# Patient Record
Sex: Male | Born: 1995 | Race: Black or African American | Hispanic: No | Marital: Single | Smoking: Current every day smoker
Health system: Southern US, Community
[De-identification: ages and names within clinical notes are randomized; demographics above are authoritative.]

---
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
## 2017-06-26 ENCOUNTER — Encounter (HOSPITAL_COMMUNITY): Payer: Self-pay | Admitting: Emergency Medicine

## 2017-06-26 ENCOUNTER — Ambulatory Visit (HOSPITAL_COMMUNITY)
Admission: EM | Admit: 2017-06-26 | Discharge: 2017-06-26 | Disposition: A | Discharge status: Home / Self Care | Payer: 59 | Attending: Internal Medicine | Admitting: Internal Medicine

## 2017-06-26 ENCOUNTER — Ambulatory Visit (INDEPENDENT_AMBULATORY_CARE_PROVIDER_SITE_OTHER): Payer: 59

## 2017-06-26 DIAGNOSIS — B9789 Other viral agents as the cause of diseases classified elsewhere: Secondary | ICD-10-CM

## 2017-06-26 DIAGNOSIS — J069 Acute upper respiratory infection, unspecified: Secondary | ICD-10-CM

## 2017-06-26 MED ORDER — FLUTICASONE PROPIONATE 50 MCG/ACT NA SUSP: 2.0000 | Freq: Every day | NASAL | 0 refills | Status: AC

## 2017-06-26 MED ORDER — BENZONATATE 100 MG PO CAPS: 100.0000 mg | ORAL_CAPSULE | Freq: Three times a day (TID) | ORAL | 0 refills | Status: AC | PRN

## 2017-06-26 MED ORDER — CETIRIZINE-PSEUDOEPHEDRINE ER 5-120 MG PO TB12: 1.0000 | ORAL_TABLET | Freq: Every day | ORAL | 0 refills | Status: AC

## 2017-06-26 NOTE — ED Provider Notes (Addendum)
[facility]   [id number] 06/26/17 Arrival Time: 1634  SUBJECTIVE:  Mark Burke is a 22 y.o. male who presents with improving cough for the past week.  Denies positive sick exposure or precipitating event.  Describes cough as intermittent and productive with yellow sputum.  Has tried theraflu without relief. Denies aggravating factors.  Reports previous symptoms in the past that improved with cough syrup.   Complains of nausea, SOB, sore throat, congestion, and rhinorrhea.  Denies fever, chills, fatigue, wheezing, chest pain, changes in bowel or bladder habits.    ROS: As per HPI.  History reviewed. No pertinent past medical history. History reviewed. No pertinent surgical history. No Known Allergies No current facility-administered medications on file prior to encounter.    No current outpatient medications on file prior to encounter.    Social History   Socioeconomic History  . Marital status: Single    Spouse name: Not on file  . Number of children: Not on file  . Years of education: Not on file  . Highest education level: Not on file  Occupational History  . Not on file  Social Needs  . Financial resource strain: Not on file  . Food insecurity:    Worry: Not on file    Inability: Not on file  . Transportation needs:    Medical: Not on file    Non-medical: Not on file  Tobacco Use  . Smoking status: Current Every Day Smoker  . Smokeless tobacco: Never Used  Substance and Sexual Activity  . Alcohol use: Never    Frequency: Never  . Drug use: Not on file  . Sexual activity: Not on file  Lifestyle  . Physical activity:    Days per week: Not on file    Minutes per session: Not on file  . Stress: Not on file  Relationships  . Social connections:    Talks on phone: Not on file    Gets together: Not on file    Attends religious service: Not on file    Active member of club or organization: Not on file    Attends meetings of clubs or  organizations: Not on file    Relationship status: Not on file  . Intimate partner violence:    Fear of current or ex partner: Not on file    Emotionally abused: Not on file    Physically abused: Not on file    Forced sexual activity: Not on file  Other Topics Concern  . Not on file  Social History Narrative  . Not on file   History reviewed. No pertinent family history.   OBJECTIVE:  Vitals:   06/26/17 1658  BP: 136/71  Pulse: 68  Resp: 18  Temp: 98.8 F (37.1 C)  TempSrc: Oral  SpO2: 98%     General appearance: AOx3; appears fatigued HEENT: Ears: EACs clear, TMs pearly gray with visible cone of light; Eye: PERRL.  EOM grossly intact.  Sinuses nontender; mild clear rhinorrhea; tonsils nonerythematous, uvula midline Neck: supple without LAD Lungs: CTA bilaterally Heart: regular rate and rhythm.  Radial pulses 2+ symmetrical bilaterally Skin: warm and dry Psychological: alert and cooperative; normal mood and affect  DIAGNOSTIC STUDIES:   CLINICAL DATA: 22 year old male with illness for 2 weeks. Productive cough. Smoker.  EXAM: CHEST - 2 VIEW  COMPARISON: None.  FINDINGS: Lung volumes are at the upper limits of normal. Cardiac size is at the upper limits of normal. Other mediastinal contours are within normal limits. Visualized  tracheal air column is within normal limits. No pneumothorax, pulmonary edema, pleural effusion or confluent pulmonary opacity.  Mild diffuse increased pulmonary interstitial markings may be related to smoking. Mild levoconvex thoracolumbar junction level scoliosis. No other osseous abnormality. Negative visible bowel gas pattern.  IMPRESSION: 1. Bilateral increased pulmonary interstitial markings could be related to smoking, but consider viral/atypical respiratory infection. 2. No other acute cardiopulmonary abnormality. Lung volumes and cardiac size are at the upper limits of normal.   Electronically Signed By: Odessa Fleming  M.D. On: 06/26/2017 17:45  I have reviewed the x-rays myself and the radiologist interpretation. I am in agreement with the radiologist interpretation.     ASSESSMENT & PLAN:  1. Viral URI with cough     Meds ordered this encounter  Medications  . cetirizine-pseudoephedrine (ZYRTEC-D) 5-120 MG tablet    Sig: Take 1 tablet by mouth daily.    Dispense:  30 tablet    Refill:  0    Order Specific Question:   Supervising Provider    Answer:   Isa Rankin [phone]  . fluticasone (FLONASE) 50 MCG/ACT nasal spray    Sig: Place 2 sprays into both nostrils daily.    Dispense:  16 g    Refill:  0    Order Specific Question:   Supervising Provider    Answer:   Isa Rankin [phone]  . benzonatate (TESSALON) 100 MG capsule    Sig: Take 1 capsule (100 mg total) by mouth 3 (three) times daily as needed for cough.    Dispense:  21 capsule    Refill:  0    Order Specific Question:   Supervising Provider    Answer:   Isa Rankin [[id number]]    Get plenty of rest and push fluids Use OTC medication as needed for symptomatic relief Prescribed tessolone perles as needed for cough Prescribed zyrtec D for nasal symptoms Flonase for nasal symptoms Follow up with PCP if symptoms persist Return or go to ER if you have any new or worsening symptoms   Reviewed expectations re: course of current medical issues. Questions answered. Outlined signs and symptoms indicating need for more acute intervention. Patient verbalized understanding. After Visit Summary given.          Rennis Harding, PA-C 06/26/17 1811    Rennis Harding, PA-C 06/26/17 1812

## 2017-06-26 NOTE — ED Triage Notes (Signed)
Pt sts URI sx  

## 2017-06-26 NOTE — Discharge Instructions (Addendum)
Get plenty of rest and push fluids Use OTC medication as needed for symptomatic relief Prescribed tessolone perles as needed for cough Prescribed zyrtec D for nasal symptoms Flonase for nasal symptoms Follow up with PCP if symptoms persist Return or go to ER if you have any new or worsening symptoms

## 2017-07-17 ENCOUNTER — Ambulatory Visit (HOSPITAL_COMMUNITY)
Admission: EM | Admit: 2017-07-17 | Discharge: 2017-07-17 | Disposition: A | Discharge status: Home / Self Care | Payer: 59 | Attending: Family Medicine | Admitting: Family Medicine

## 2017-07-17 ENCOUNTER — Encounter (HOSPITAL_COMMUNITY): Payer: Self-pay | Admitting: *Deleted

## 2017-07-17 DIAGNOSIS — H169 Unspecified keratitis: Secondary | ICD-10-CM | POA: Diagnosis not present

## 2017-07-17 MED ORDER — TETRACAINE HCL 0.5 % OP SOLN
OPHTHALMIC | Status: AC
  Filled 2017-07-17: qty 4

## 2017-07-17 NOTE — Discharge Instructions (Addendum)
Further evaluation and management recommend with ophthalmology  Appointment made at 11:00 o'clock today Patient aware and in agreement with this plan

## 2017-07-17 NOTE — ED Triage Notes (Addendum)
Patient reports left ear pain-no drainage or itching. Denies injury. Does not war contacts. Reports 3 day history of the pain.   Patients eye watering during triage. Patient states it feels like something is in my eye, patient points to white dot on iris.

## 2017-07-17 NOTE — ED Provider Notes (Signed)
[facility]   [id number] 07/17/17 Arrival Time: 0916   SUBJECTIVE:  Mark Burke is a 22 y.o. male who presents with complaint of FB sensation in left eye that began abruptly 1 week ago.  Denies a precipitating event, trauma, or personal contact with bacterial conjunctivitis, but was playing football outside around the time of symtpoms.  Has tried OTC eye drops with temporary relief.  Symptoms are made worse with rubbing eye.  Reports photophobia, excessive tearing, and eye irritation.  Denies eye pain, discharge, itching, vision changes, or double vision.     Denies contact lens use.    ROS: As per HPI.  History reviewed. No pertinent past medical history. History reviewed. No pertinent surgical history. No Known Allergies No current facility-administered medications on file prior to encounter.    Current Outpatient Medications on File Prior to Encounter  Medication Sig Dispense Refill  . benzonatate (TESSALON) 100 MG capsule Take 1 capsule (100 mg total) by mouth 3 (three) times daily as needed for cough. 21 capsule 0  . cetirizine-pseudoephedrine (ZYRTEC-D) 5-120 MG tablet Take 1 tablet by mouth daily. 30 tablet 0  . fluticasone (FLONASE) 50 MCG/ACT nasal spray Place 2 sprays into both nostrils daily. 16 g 0   Social History   Socioeconomic History  . Marital status: Single    Spouse name: Not on file  . Number of children: Not on file  . Years of education: Not on file  . Highest education level: Not on file  Occupational History  . Not on file  Social Needs  . Financial resource strain: Not on file  . Food insecurity:    Worry: Not on file    Inability: Not on file  . Transportation needs:    Medical: Not on file    Non-medical: Not on file  Tobacco Use  . Smoking status: Current Every Day Smoker  . Smokeless tobacco: Never Used  Substance and Sexual Activity  . Alcohol use: Never    Frequency: Never  . Drug use: Not on file  . Sexual  activity: Not on file  Lifestyle  . Physical activity:    Days per week: Not on file    Minutes per session: Not on file  . Stress: Not on file  Relationships  . Social connections:    Talks on phone: Not on file    Gets together: Not on file    Attends religious service: Not on file    Active member of club or organization: Not on file    Attends meetings of clubs or organizations: Not on file    Relationship status: Not on file  . Intimate partner violence:    Fear of current or ex partner: Not on file    Emotionally abused: Not on file    Physically abused: Not on file    Forced sexual activity: Not on file  Other Topics Concern  . Not on file  Social History Narrative  . Not on file   Family History  Problem Relation Age of Onset  . Healthy Mother   . Healthy Father     OBJECTIVE:    Visual Acuity  Right Eye Distance:   Left Eye Distance:   Bilateral Distance:    Right Eye Near: R Near: 20/25 Left Eye Near:  L Near: 20/25 Bilateral Near:      Vitals:   07/17/17 1001  BP: (!) 108/53  Pulse: 62  Resp: 16  Temp: 98.5 F (36.9 C)  TempSrc: Oral  SpO2: 100%    General appearance: alert; appears uncomfortable; sitting in dark room Eyes: LEFT EYE:  conjunctiva: 1+ injection with white patch appreciated in the 6 o' clock position of conjunctiva overlying the iris; no florescence uptake appreciated Neck: supple Lungs: clear to auscultation bilaterally Heart: regular rate and rhythm Skin: warm and dry Psychological: alert and cooperative; normal mood and affect   ASSESSMENT & PLAN:  1. Keratitis     No orders of the defined types were placed in this encounter.  Further evaluation and management recommend with ophthalmology  Appointment made at 11:00 o'clock today Patient aware and in agreement with this plan  Reviewed expectations re: course of current medical issues. Questions answered. Outlined signs and symptoms indicating need for more acute  intervention. Patient verbalized understanding. After Visit Summary given.   Rennis Harding, PA-C 07/17/17 1050

## 2019-02-07 ENCOUNTER — Encounter (HOSPITAL_COMMUNITY): Payer: Self-pay | Admitting: *Deleted

## 2019-02-07 ENCOUNTER — Other Ambulatory Visit: Payer: Self-pay

## 2019-02-07 ENCOUNTER — Emergency Department (HOSPITAL_COMMUNITY)
Admission: EM | Admit: 2019-02-07 | Discharge: 2019-02-07 | Disposition: A | Discharge status: Home / Self Care | Payer: 59 | Attending: Emergency Medicine | Admitting: Emergency Medicine

## 2019-02-07 DIAGNOSIS — H1032 Unspecified acute conjunctivitis, left eye: Secondary | ICD-10-CM | POA: Diagnosis not present

## 2019-02-07 DIAGNOSIS — H5712 Ocular pain, left eye: Secondary | ICD-10-CM | POA: Diagnosis present

## 2019-02-07 MED ORDER — IBUPROFEN 400 MG PO TABS
600.0000 mg | ORAL_TABLET | Freq: Once | ORAL | Status: AC
  Administered 2019-02-07: 600 mg via ORAL
  Filled 2019-02-07: qty 1

## 2019-02-07 MED ORDER — POLYMYXIN B-TRIMETHOPRIM 10000-0.1 UNIT/ML-% OP SOLN
1.0000 [drp] | OPHTHALMIC | Status: DC
  Administered 2019-02-07: 1 [drp] via OPHTHALMIC
  Filled 2019-02-07: qty 10

## 2019-02-07 MED ORDER — FLUORESCEIN SODIUM 1 MG OP STRP
1.0000 | ORAL_STRIP | Freq: Once | OPHTHALMIC | Status: AC
  Administered 2019-02-07: 1 via OPHTHALMIC
  Filled 2019-02-07: qty 1

## 2019-02-07 NOTE — ED Provider Notes (Signed)
[facility] EMERGENCY DEPARTMENT Provider Note   CSN: [id number] Arrival date & time: 02/07/19  2056     History Chief Complaint  Patient presents with  . Eye Problem    Mark Burke is a 24 y.o. male with no pertinent past medical history who presents today for evaluation of left eye pain.  He reports that for about 1 week he has had left eye itching, pain, and watering.  He states that when he wakes up in the morning it is crusted shut.  He states that about 2 or 3 weeks ago he saw a friend's child who had an eye infection however there was over a week between that and him developing his symptoms.  He denies any vision changes.  No fevers or headache.  No significant photophobia.  He denies any trauma.  He denies any concern for foreign body.  He does not wear contacts.   HPI     History reviewed. No pertinent past medical history.  There are no problems to display for this patient.   History reviewed. No pertinent surgical history.     Family History  Problem Relation Age of Onset  . Healthy Mother   . Healthy Father     Social History   Tobacco Use  . Smoking status: Current Every Day Smoker  . Smokeless tobacco: Never Used  Substance Use Topics  . Alcohol use: Never  . Drug use: Not on file    Home Medications Prior to Admission medications   Medication Sig Start Date End Date Taking? Authorizing Provider  benzonatate (TESSALON) 100 MG capsule Take 1 capsule (100 mg total) by mouth 3 (three) times daily as needed for cough. 06/26/17   Wurst, [place], PA-C  cetirizine-pseudoephedrine (ZYRTEC-D) 5-120 MG tablet Take 1 tablet by mouth daily. 06/26/17   Wurst, [place], PA-C  fluticasone (FLONASE) 50 MCG/ACT nasal spray Place 2 sprays into both nostrils daily. 06/26/17   Wurst, [place], PA-C    Allergies    Patient has no known allergies.  Review of Systems   Review of Systems  Constitutional: Negative for chills and fever.  Eyes:  Positive for pain, discharge, redness and itching. Negative for photophobia and visual disturbance.  All other systems reviewed and are negative.   Physical Exam Updated Vital Signs BP 116/68 (BP Location: Right Arm)   Pulse 68   Temp 98.5 F (36.9 C) (Oral)   Resp 16   Ht 6\' 3"  (1.905 m)   Wt 86.6 kg   SpO2 100%   BMI 23.87 kg/m   Physical Exam Vitals and nursing note reviewed.  Constitutional:      General: He is not in acute distress. HENT:     Head: Normocephalic and atraumatic.     Nose: Nose normal.     Mouth/Throat:     Mouth: Mucous membranes are moist.  Eyes:     General: Lids are normal. Vision grossly intact. Gaze aligned appropriately.        Left eye: Discharge present.    Conjunctiva/sclera:     Right eye: Right conjunctiva is not injected. No chemosis or exudate.    Left eye: Left conjunctiva is not injected. Chemosis (Mild) present. No exudate or hemorrhage.    Pupils: Pupils are equal, round, and reactive to light.     Left eye: No corneal abrasion or fluorescein uptake. Seidel exam negative.    Slit lamp exam:    Right eye: No photophobia.  Left eye: No photophobia.     Visual Fields: Right eye visual fields normal and left eye visual fields normal.     Comments: No photophobia or consensual photophobia.  Bilateral EOMs intact without pain. No periorbital edema bilaterally. There is evidence of discahrge from the left eye crusted around the eye.    Cardiovascular:     Rate and Rhythm: Normal rate.  Musculoskeletal:     Cervical back: Normal range of motion. No rigidity.  Lymphadenopathy:     Cervical: No cervical adenopathy.  Neurological:     Mental Status: He is alert.     ED Results / Procedures / Treatments   Labs (all labs ordered are listed, but only abnormal results are displayed) Labs Reviewed - No data to display  EKG None  Radiology No results found.  Procedures Procedures (including critical care time)  Medications  Ordered in ED Medications  trimethoprim-polymyxin b (POLYTRIM) ophthalmic solution 1 drop (1 drop Left Eye Given 02/07/19 2303)  fluorescein ophthalmic strip 1 strip (1 strip Left Eye Given 02/07/19 2127)  ibuprofen (ADVIL) tablet 600 mg (600 mg Oral Given 02/07/19 2253)    ED Course  I have reviewed the triage vital signs and the nursing notes.  Pertinent labs & imaging results that were available during my care of the patient were reviewed by me and considered in my medical decision making (see chart for details).    MDM Rules/Calculators/A&P                     Mark Burke is a healthy 24 year old male who presents today for evaluation of approximately 1 week of left eye itching, irritation, and drainage.  He denies any concern for foreign body.  No fluorescein uptake.  He denies any visual changes or fevers.  Do not suspect corneal abrasion.  In the absence of trauma the suspect laceration or rupture.  He does not have photophobia or consensual photophobia, no pain with extraocular range of motion's, this is not consistent with iritis, uveitis or post/preseptal infection.  Patient is treated with Polytrim and OTC pain medications.  Recommended ophthalmology follow-up in the next 2 days or sooner in the ER if condition worsens or new concerns arise.  Return precautions were discussed with patient who states their understanding.  At the time of discharge patient denied any unaddressed complaints or concerns.  Patient is agreeable for discharge home.  Note: Portions of this report may have been transcribed using voice recognition software. Every effort was made to ensure accuracy; however, inadvertent computerized transcription errors may be present   Final Clinical Impression(s) / ED Diagnoses Final diagnoses:  Acute bacterial conjunctivitis of left eye    Rx / DC Orders ED Discharge Orders    None       Ollen Gross 02/07/19 2339    Little, Wenda Overland, MD 02/08/19 [phone]

## 2019-02-07 NOTE — ED Notes (Signed)
Pt discharge and prescription education provided. Pt verbalizes understanding. Pt is alert and oriented x 4 and ambulatory at discharge.  

## 2019-02-07 NOTE — ED Triage Notes (Signed)
Lt eye itching watering painful for one week

## 2019-02-07 NOTE — Discharge Instructions (Addendum)
To use your eyedrops please place 1 drop in your left eye every 3 hours while you are awake for the next 10 days. Please call the ophthalmologist to schedule a follow-up appointment. If you develop fevers, vision changes or have concerns please seek additional medical care and evaluation.  Please take Ibuprofen (Advil, motrin) and Tylenol (acetaminophen) to relieve your pain.  You may take up to 600 MG (3 pills) of normal strength ibuprofen every 8 hours as needed.  In between doses of ibuprofen you make take tylenol, up to 1,000 mg (two extra strength pills).  Do not take more than 3,000 mg tylenol in a 24 hour period.  Please check all medication labels as many medications such as pain and cold medications may contain tylenol.  Do not drink alcohol while taking these medications.  Do not take other NSAID'S while taking ibuprofen (such as aleve or naproxen).  Please take ibuprofen with food to decrease stomach upset.

## 2019-04-05 IMAGING — DX DG CHEST 2V
2 series · 2 of 2 positions shown · non-contrast
Comparison: None.

CLINICAL DATA: 21-year-old male with illness for 2 weeks.
Productive cough. Smoker.

EXAM:
CHEST - 2 VIEW

[chest pa]
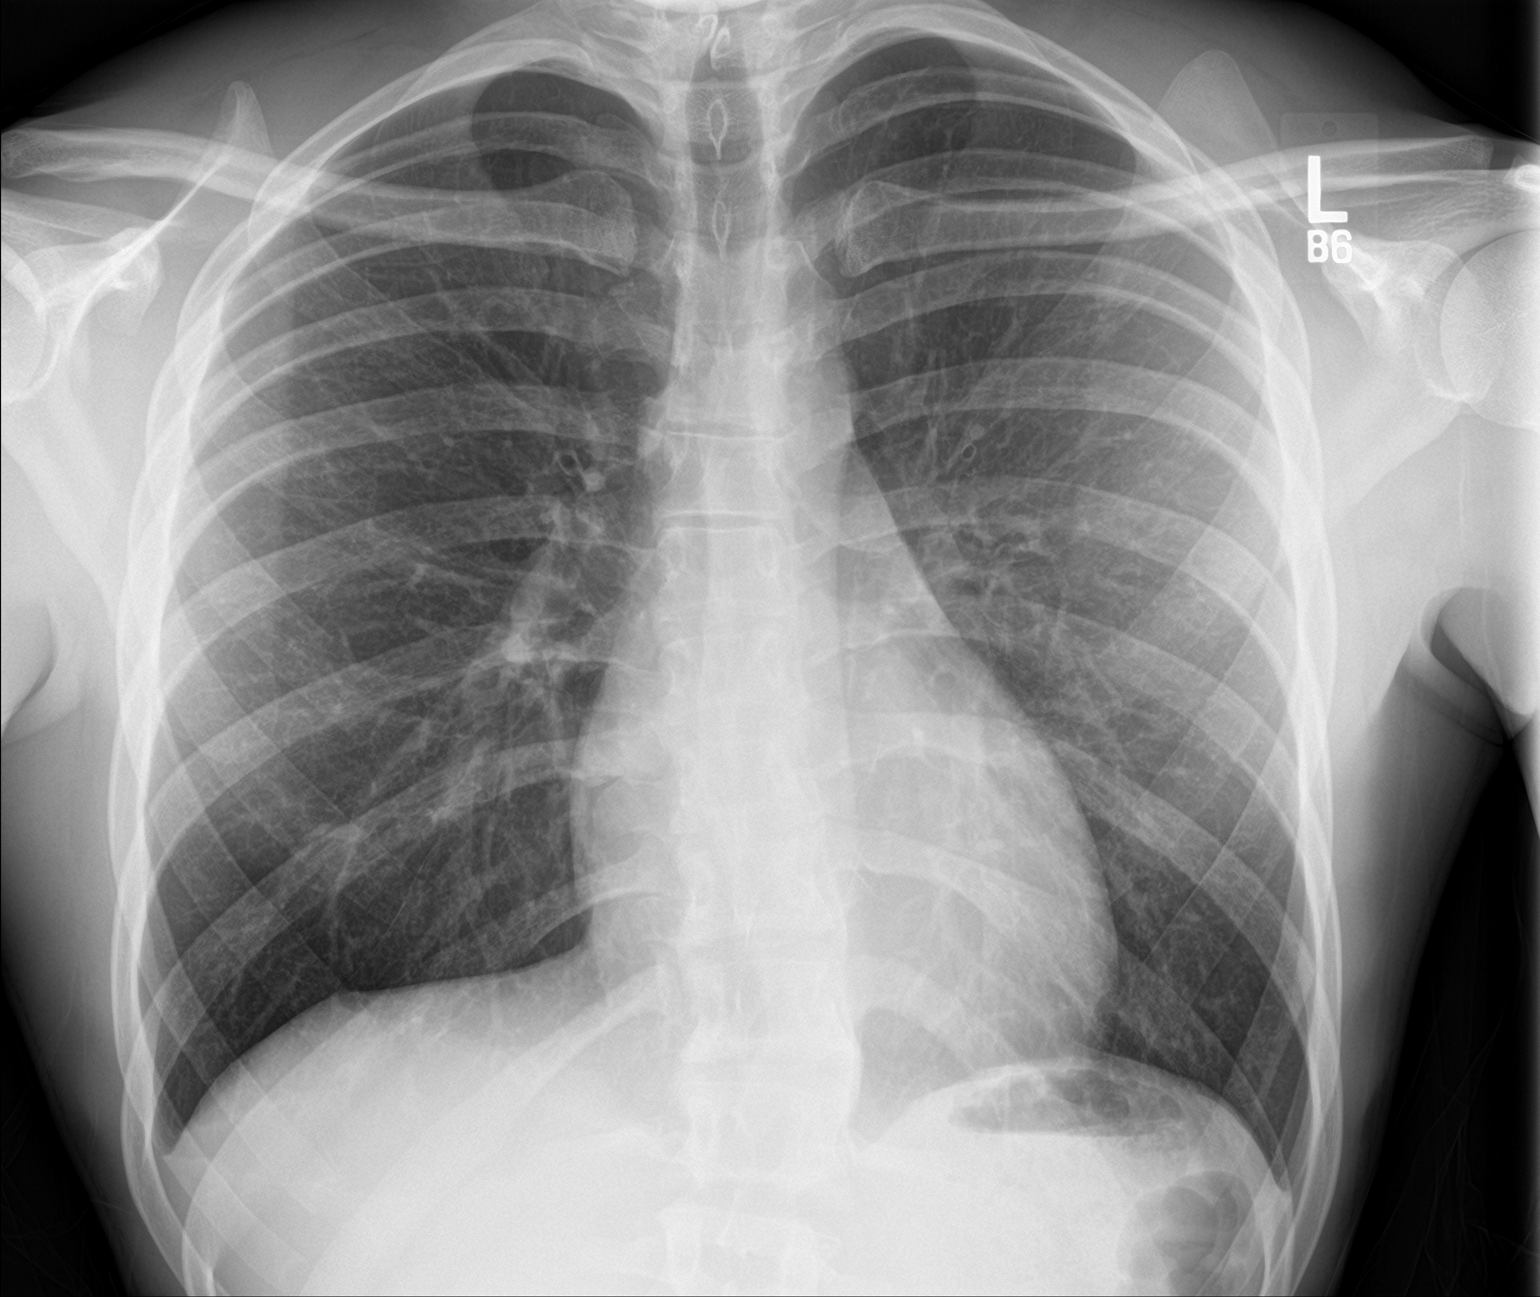

[chest lat]
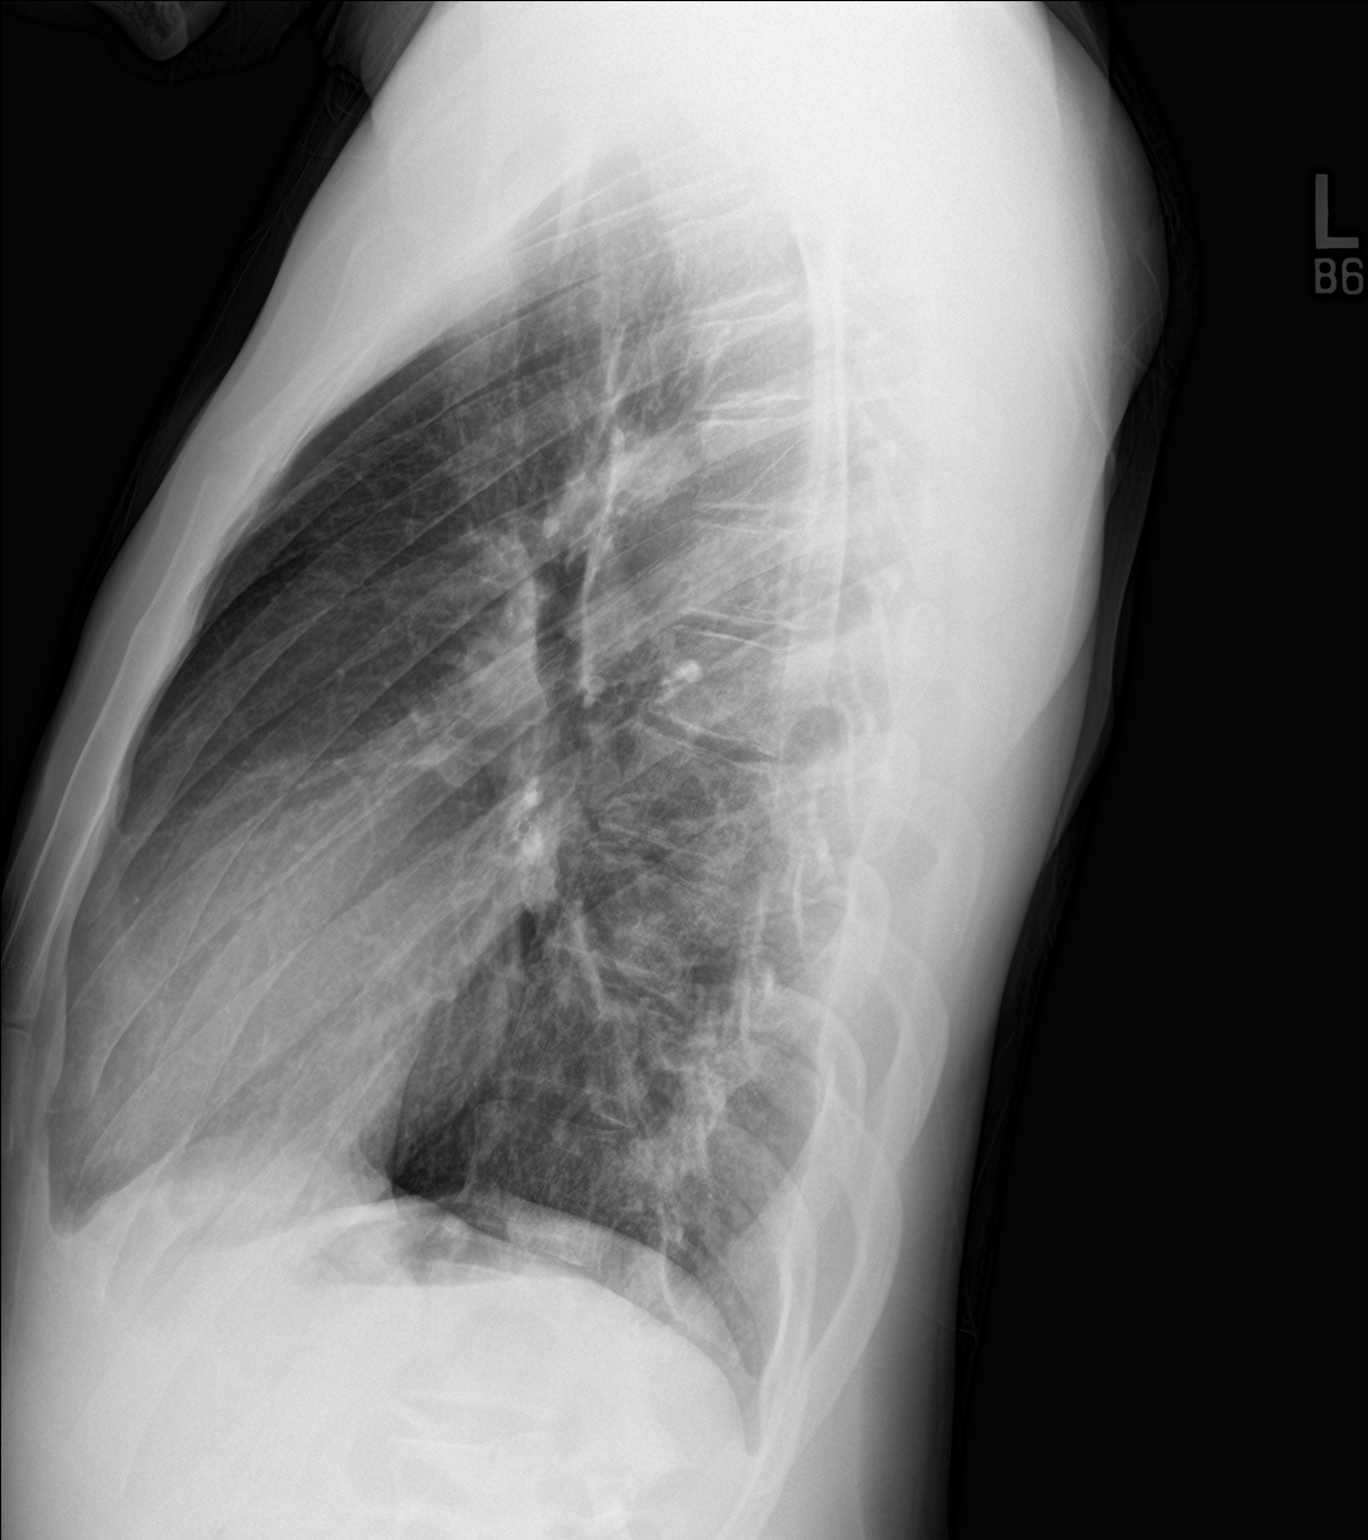

[2 of 2 positions shown; findings below may reference images not displayed]

FINDINGS: Lung volumes are at the upper limits of normal. Cardiac size is at
the upper limits of normal. Other mediastinal contours are within
normal limits. Visualized tracheal air column is within normal
limits. No pneumothorax, pulmonary edema, pleural effusion or
confluent pulmonary opacity.

Mild diffuse increased pulmonary interstitial markings may be
related to smoking. Mild levoconvex thoracolumbar junction level
scoliosis. No other osseous abnormality. Negative visible bowel gas
pattern.
IMPRESSION: 1. Bilateral increased pulmonary interstitial markings could be
related to smoking, but consider viral/atypical respiratory
infection.
2. No other acute cardiopulmonary abnormality. Lung volumes and
cardiac size are at the upper limits of normal.
